# Patient Record
Sex: Male | Born: 1945 | Race: White | Hispanic: Yes | Marital: Married | Smoking: Former smoker
Health system: Southern US, Community
[De-identification: ages and names within clinical notes are randomized; demographics above are authoritative.]

---

## 2012-10-26 ENCOUNTER — Ambulatory Visit (INDEPENDENT_AMBULATORY_CARE_PROVIDER_SITE_OTHER): Payer: Medicare Other | Admitting: Internal Medicine

## 2012-10-26 VITALS — BP 158/90 | HR 76 | Temp 98.1°F | Resp 18 | Ht 66.0 in | Wt 171.4 lb

## 2012-10-26 DIAGNOSIS — L989 Disorder of the skin and subcutaneous tissue, unspecified: Secondary | ICD-10-CM

## 2012-10-26 DIAGNOSIS — L723 Sebaceous cyst: Secondary | ICD-10-CM

## 2012-10-26 DIAGNOSIS — F809 Developmental disorder of speech and language, unspecified: Secondary | ICD-10-CM

## 2012-10-26 NOTE — Patient Instructions (Addendum)
Extirpacin de un quiste (Cyst Removal) El profesional que lo asiste le ha extirpado un quiste. Un quiste es una cavidad que contiene material semilquido. Puede aparecer en cualquier lugar del cuerpo. Un quiste puede permanecer pequeo durante aos o aumentar de tamao gradualmente. Un quiste sebceo es un glndula sudorpara dilatada (que se [place]), y que se llena con secrecin sebcea (sudor). Si no se trata, puede agrandarse (hasta el tamao de una pelota de softball) con el paso de [place]. Generalmente son extirpados por motivos cosmticos (mejorar la apariencia) o antes de que se infecte para formar un absceso. Un absceso es una estructura qustica llena de pus. INSTRUCCIONES PARA EL CUIDADO DOMICILIARIO  Mantenga el vendaje, limpio y seco. Puede cambiarlo despus de 24 horas. Si el vendaje se adhiere, use agua tibia para aflojarlo suavemente. Seque la zona dando pequeos golpes con una toalla limpia antes de colocarse otro vendaje.  En lo posible, mantenga elevada la zona en la que fue extirpado el quiste para aliviar la hinchazn, Chief Technology Officer y Biochemist, clinical curacin.  Si tiene una sutura, mantngala limpia y [place].  Puede limpiar la sutura suavemente con un hisopo de algodn mojado en agua jabonosa tibia.  No remoje la zona en la que fue extirpado el quiste ni practique natacin. Puede ducharse.  No utilice demasiado la zona en la que fue extirpado el quiste.  Regrese dentro de [address], o cuando se lo indiquen para Oceanographer los puntos de la sutura.  Tome los [employer] le indic el mdico. SOLICITE ATENCIN MDICA DE INMEDIATO SI:  La temperatura oral se eleva sin motivo por encima de 102 F (38.9 C) y no puede controlarla con los medicamentos, o segn le indique el profesional que lo asiste.  La sangre sigue mojando el vendaje.  Aumenta el dolor en la zona en la que fue extirpado el [place].  Presenta enrojecimiento, hinchazn, pus, mal olor, inflamacin (irritacin) observa  rayas rojas que salen de la sutura. Estos son signos de infeccin. EST SEGURO QUE:   Comprende las instrucciones para el alta mdica.  Controlar su enfermedad.  Solicitar atencin mdica de inmediato segn las indicaciones. Document Released: 09/24/2005 Document Revised: 03/08/2012 [facility] Patient Information 2013 [place], [place]. Cuidados de una herida  (Wound Care)  El cuidado de la herida evita el dolor y la infeccin.  Deber aplicarse la vacuna contra el ttanos si:  No recuerda cundo se coloc la vacuna la ltima vez.   Nunca recibi esta vacuna.   La lesin ha [employer].  Si usted necesita aplicarse la vacuna y se niega a recibirla, corre riesgo de contraer ttanos. sta es una enfermedad grave.  CUIDADOS EN EL HOGAR   Slo tome la medicacin segn las indicaciones.   Limpie la herida [employer] con [place] y [place], segn las indicaciones.   Cambie elvendaje) tal como se le indic.   Aplique una crema con medicamento sobre la herida, segn le indique el mdico.   Cmbielo si se moja, se ensucia o huele mal.   Puede tomar Shaune Spittle. No debe tomar baos de inmersin, nadar ni hacer nada que haga que la herida se encuentre debajo del agua.   Mantenga elevada y en reposo la zona lesionada hasta que el dolor y la hinchazn mejoren.   Cumpla con los controles mdicos segn las indicaciones.  SOLICITE AYUDA DE INMEDIATO SI:   Observa que un lquido blanco amarillento (pus) aparece en la zona de la herida.   Los [employer]  no le Corporate treasurer.   Hay rayas rojas que salen de la herida.   No puede mover los dedos.   Tiene fiebre.  ASEGRESE DE QUE:   Comprende estas instrucciones.   Controlar su enfermedad.   Solicitar ayuda de inmediato si no mejora o si empeora.  Document Released: 08/13/2011 Document Revised: 12/04/2011 [facility] Patient Information 2012 [place], [place].

## 2012-10-26 NOTE — Progress Notes (Signed)
   Patient ID: Ladarious Kresse MRN: [id number], DOB: 1946-11-01, 66 y.o. Date of Encounter: 10/26/2012, 2:13 PM    PROCEDURE NOTE: Verbal consent obtained. Betadine prep per usual protocol. Local anesthesia obtained with 1% plain lidocaine 1.5 cc.  1 cm incision made with 11 blade along lesion.  Large amount of sebaceous material expressed No purulence expressed Lesion explored revealing no loculations. Irrigated with normal saline Dressed. Wound care instructions including precautions with patient. Patient tolerated the procedure well.   Signed, Eula Listen, PA-C 10/26/2012 2:13 PM

## 2012-10-26 NOTE — Progress Notes (Signed)
  Subjective:    Patient ID: Jose Mack, male    DOB: 10/23/1946, 66 y.o.   MRN: [id number]  HPI Has slow growing sebacious cyst right face near ear. Hx of I and D in past. Not red , warm, or tender. Able to express small stream of cyst goop.   Review of Systems    neg Objective:   Physical Exam 3-4 cm fluctuant non tender cyst right face  Mr. Tennis Must to ID       Assessment & Plan:  Wound care

## 2018-01-04 ENCOUNTER — Ambulatory Visit: Payer: Medicare Other | Admitting: Physician Assistant

## 2018-01-05 ENCOUNTER — Ambulatory Visit: Payer: Medicare Other | Admitting: Physician Assistant

## 2018-01-07 ENCOUNTER — Encounter (HOSPITAL_COMMUNITY): Payer: Self-pay

## 2018-01-07 ENCOUNTER — Encounter: Payer: Self-pay | Admitting: Physician Assistant

## 2018-01-07 ENCOUNTER — Emergency Department (HOSPITAL_COMMUNITY): Payer: Medicare Other

## 2018-01-07 ENCOUNTER — Ambulatory Visit (INDEPENDENT_AMBULATORY_CARE_PROVIDER_SITE_OTHER): Payer: Medicare Other | Admitting: Physician Assistant

## 2018-01-07 VITALS — BP 182/90 | HR 76 | Temp 98.0°F | Resp 16 | Ht 66.0 in | Wt 194.0 lb

## 2018-01-07 DIAGNOSIS — H539 Unspecified visual disturbance: Secondary | ICD-10-CM | POA: Diagnosis not present

## 2018-01-07 DIAGNOSIS — H538 Other visual disturbances: Secondary | ICD-10-CM

## 2018-01-07 DIAGNOSIS — I10 Essential (primary) hypertension: Secondary | ICD-10-CM | POA: Insufficient documentation

## 2018-01-07 DIAGNOSIS — Z87891 Personal history of nicotine dependence: Secondary | ICD-10-CM | POA: Insufficient documentation

## 2018-01-07 DIAGNOSIS — R030 Elevated blood-pressure reading, without diagnosis of hypertension: Secondary | ICD-10-CM | POA: Diagnosis not present

## 2018-01-07 LAB — CBC
HCT: 45.1 % (ref 39.0–52.0)
Hemoglobin: 14.6 g/dL (ref 13.0–17.0)
MCH: 29.3 pg (ref 26.0–34.0)
MCHC: 32.4 g/dL (ref 30.0–36.0)
MCV: 90.6 fL (ref 78.0–100.0)
Platelets: 169 10*3/uL (ref 150–400)
RBC: 4.98 MIL/uL (ref 4.22–5.81)
RDW: 13.8 % (ref 11.5–15.5)
WBC: 8.9 10*3/uL (ref 4.0–10.5)

## 2018-01-07 LAB — I-STAT CHEM 8, ED
BUN: 9 mg/dL (ref 6–20)
CHLORIDE: 106 mmol/L (ref 101–111)
Calcium, Ion: 1.17 mmol/L (ref 1.15–1.40)
Creatinine, Ser: 0.7 mg/dL (ref 0.61–1.24)
GLUCOSE: 103 mg/dL — AB (ref 65–99)
HCT: 47 % (ref 39.0–52.0)
Hemoglobin: 16 g/dL (ref 13.0–17.0)
POTASSIUM: 4 mmol/L (ref 3.5–5.1)
Sodium: 139 mmol/L (ref 135–145)
TCO2: 23 mmol/L (ref 22–32)

## 2018-01-07 LAB — I-STAT TROPONIN, ED: Troponin i, poc: 0 ng/mL (ref 0.00–0.08)

## 2018-01-07 LAB — COMPREHENSIVE METABOLIC PANEL
ALBUMIN: 4.1 g/dL (ref 3.5–5.0)
ALK PHOS: 87 U/L (ref 38–126)
ALT: 20 U/L (ref 17–63)
AST: 22 U/L (ref 15–41)
Anion gap: 10 (ref 5–15)
BUN: 8 mg/dL (ref 6–20)
CHLORIDE: 105 mmol/L (ref 101–111)
CO2: 22 mmol/L (ref 22–32)
Calcium: 9.2 mg/dL (ref 8.9–10.3)
Creatinine, Ser: 0.85 mg/dL (ref 0.61–1.24)
GFR calc Af Amer: >60 mL/min (ref >60)
GFR calc non Af Amer: >60 mL/min (ref >60)
GLUCOSE: 100 mg/dL — AB (ref 65–99)
Potassium: 4 mmol/L (ref 3.5–5.1)
SODIUM: 137 mmol/L (ref 135–145)
Total Bilirubin: 0.6 mg/dL (ref 0.3–1.2)
Total Protein: 7.5 g/dL (ref 6.5–8.1)

## 2018-01-07 LAB — DIFFERENTIAL
BASOS ABS: 0 10*3/uL (ref 0.0–0.1)
BASOS PCT: 0 %
Eosinophils Absolute: 0.1 10*3/uL (ref 0.0–0.7)
Eosinophils Relative: 1 %
LYMPHS PCT: 24 %
Lymphs Abs: 2.2 10*3/uL (ref 0.7–4.0)
MONO ABS: 0.6 10*3/uL (ref 0.1–1.0)
Monocytes Relative: 6 %
NEUTROS ABS: 6 10*3/uL (ref 1.7–7.7)
NEUTROS PCT: 69 %

## 2018-01-07 LAB — POCT URINALYSIS DIP (MANUAL ENTRY)
BILIRUBIN UA: NEGATIVE mg/dL
Bilirubin, UA: NEGATIVE
Glucose, UA: NEGATIVE mg/dL
Leukocytes, UA: NEGATIVE
Nitrite, UA: NEGATIVE
PROTEIN UA: NEGATIVE mg/dL
SPEC GRAV UA: 1.01 (ref 1.010–1.025)
UROBILINOGEN UA: 0.2 U/dL
pH, UA: 6 (ref 5.0–8.0)

## 2018-01-07 LAB — PROTIME-INR
INR: 1
PROTHROMBIN TIME: 13.1 s (ref 11.4–15.2)

## 2018-01-07 LAB — APTT: APTT: 29 s (ref 24–36)

## 2018-01-07 NOTE — Patient Instructions (Addendum)
Please head immediately to the [facility].   [address] [place], [phone] for your high blood pressure.     IF you received an x-ray today, you will receive an invoice from [facility]. Please contact [facility] at [phone] with questions or concerns regarding your invoice.   IF you received labwork today, you will receive an invoice from [facility]. Please contact [facility] at [phone] with questions or concerns regarding your invoice.   Our billing staff will not be able to assist you with questions regarding bills from these companies.  You will be contacted with the lab results as soon as they are available. The fastest way to get your results is to activate your My Chart account. Instructions are located on the last page of this paperwork. If you have not heard from us regarding the results in 2 weeks, please contact this office.

## 2018-01-07 NOTE — ED Notes (Signed)
Pt from [facility] due to hypertension 212/112 and c/o dyspnea and blurry vision ongoing x 5 months

## 2018-01-07 NOTE — Progress Notes (Signed)
PRIMARY CARE AT [place], [place] [address] [phone]  Date:  01/07/2018   Name:  Jose Mack   DOB:  Apr 15, 1946   MRN:  [id number]  PCP:  Patient, No Pcp Per    History of Present Illness:  Jose Mack is a 72 y.o. male patient who presents to PCP with  Chief Complaint  Patient presents with  . Blurred Vision    x 5 months/ losing vision in one eye, both.  . Hypertension    210/102,212/131  . other    the person with the pt states that he does not say the illness that are bothering him     5 months ago, developed some vision changes.  Reports that oil was in his eye, though his partner states that he did not have a change in his bp.  He has had 1 week of extreme cloudiness in his right eye. He has had to move the tv forward due to the vision changes.   Daughter would like his ekg performed.  No dizziness.  No difficulty with breathing.   He feels some difficulty with his breathing, as if "he is not breathing correctly".  5 months ago, daughter reports he would work with her, but now he resides in his house.   Daughter states that she is concerned that he will not follow up and would like a full work up.    There are no active problems to display for this patient.   No past medical history on file.  No past surgical history on file.  Social History   Tobacco Use  . Smoking status: Former Smoker    Last attempt to quit: 10/27/2007    Years since quitting: 10.2  Substance Use Topics  . Alcohol use: No  . Drug use: No    No family history on file.  Not on File  Medication list has been reviewed and updated.  No current outpatient medications on file prior to visit.   No current facility-administered medications on file prior to visit.     ROS ROS otherwise unremarkable unless listed above.  Physical Examination: BP (!) 212/131   Pulse 76   Temp 98 F (36.7 C) (Oral)   Resp 16   Ht '5\' 6"'  (1.676 m)   Wt 194 lb (88 kg)   SpO2 96%   BMI  31.31 kg/m  Ideal Body Weight: Weight in (lb) to have BMI = 25: 154.6  Physical Exam  Constitutional: He is oriented to person, place, and time. He appears well-developed and well-nourished. No distress.  HENT:  Head: Normocephalic and atraumatic.  Eyes: EOM are normal. Pupils are equal, round, and reactive to light. Right conjunctiva is injected. Left conjunctiva is injected.  Arcus senilis.  Fundoscopic exam.    Cardiovascular: Normal rate, regular rhythm and normal heart sounds. Exam reveals no friction rub.  No murmur heard. Pulses:      Radial pulses are 2+ on the right side, and 2+ on the left side.       Dorsalis pedis pulses are 2+ on the right side, and 2+ on the left side.  Pulmonary/Chest: Effort normal and breath sounds normal. No stridor. No apnea. No respiratory distress. He has no wheezes.  Neurological: He is alert and oriented to person, place, and time.  Skin: Skin is warm and dry. He is not diaphoretic.  Psychiatric: He has a normal mood and affect. His behavior is normal.     Visual  Acuity Screening   Right eye Left eye Both eyes  Without correction:  20/70 20/20  With correction:     Comments: Pt states he can't see any letters on chart with right eye   Assessment and Plan: Jose Mack is a 72 y.o. male who is here today for cc of  Chief Complaint  Patient presents with  . Blurred Vision    x 5 months/ losing vision in one eye, both.  . Hypertension    210/102,212/131  . other    the person with the pt states that he does not say the illness that are bothering him  concern of target organ damage.  Advised option of controlling blood pressure slowly, and referral with eye doctor and, and CT stat.  Patient's daughter would like patient to be seen at the hospital.   Vision changes - Plan: CMP14+EGFR, POCT urinalysis dipstick  Elevated blood pressure reading - Plan: CMP14+EGFR, EKG 12-Lead, POCT urinalysis dipstick  Ivar Drape, PA-C [facility] 1/11/201910:47 AM

## 2018-01-07 NOTE — ED Triage Notes (Addendum)
Per Pt, Pt is coming from PCP with complaints of Hypertension, and vision loss in right eye that is cloudy starting three days ago. No other neuro deficits noted. Denies CP.

## 2018-01-08 ENCOUNTER — Encounter: Payer: Self-pay | Admitting: Physician Assistant

## 2018-01-08 ENCOUNTER — Emergency Department (HOSPITAL_COMMUNITY)
Admission: EM | Admit: 2018-01-08 | Discharge: 2018-01-08 | Disposition: A | Discharge status: Home / Self Care | Payer: Medicare Other | Attending: Emergency Medicine | Admitting: Emergency Medicine

## 2018-01-08 DIAGNOSIS — H539 Unspecified visual disturbance: Secondary | ICD-10-CM

## 2018-01-08 DIAGNOSIS — I10 Essential (primary) hypertension: Secondary | ICD-10-CM

## 2018-01-08 LAB — CMP14+EGFR
A/G RATIO: 1.6 (ref 1.2–2.2)
ALBUMIN: 4.5 g/dL (ref 3.5–4.8)
ALK PHOS: 97 IU/L (ref 39–117)
ALT: 17 IU/L (ref 0–44)
AST: 19 IU/L (ref 0–40)
BUN / CREAT RATIO: 12 (ref 10–24)
BUN: 9 mg/dL (ref 8–27)
Bilirubin Total: 0.2 mg/dL (ref 0.0–1.2)
CO2: 23 mmol/L (ref 20–29)
Calcium: 9.3 mg/dL (ref 8.6–10.2)
Chloride: 103 mmol/L (ref 96–106)
Creatinine, Ser: 0.77 mg/dL (ref 0.76–1.27)
GFR calc Af Amer: 106 mL/min/{1.73_m2} (ref >59)
GFR calc non Af Amer: 91 mL/min/{1.73_m2} (ref >59)
GLOBULIN, TOTAL: 2.8 g/dL (ref 1.5–4.5)
Glucose: 105 mg/dL — ABNORMAL HIGH (ref 65–99)
POTASSIUM: 5 mmol/L (ref 3.5–5.2)
SODIUM: 141 mmol/L (ref 134–144)
Total Protein: 7.3 g/dL (ref 6.0–8.5)

## 2018-01-08 LAB — CBG MONITORING, ED: Glucose-Capillary: 94 mg/dL (ref 65–99)

## 2018-01-08 MED ORDER — TETRACAINE HCL 0.5 % OP SOLN
2.0000 [drp] | Freq: Once | OPHTHALMIC | Status: AC
  Administered 2018-01-08: 2 [drp] via OPHTHALMIC
  Filled 2018-01-08: qty 4

## 2018-01-08 MED ORDER — FLUORESCEIN SODIUM 1 MG OP STRP
ORAL_STRIP | OPHTHALMIC | Status: AC
  Administered 2018-01-08: 1
  Filled 2018-01-08: qty 1

## 2018-01-08 NOTE — ED Notes (Signed)
Visual acuity  R eye- 20/100 L eye- Unable to see Both- 20/100

## 2018-01-08 NOTE — ED Provider Notes (Signed)
[facility] EMERGENCY DEPARTMENT Provider Note   CSN: [id number] Arrival date & time: 01/07/18  1525     History   Chief Complaint Chief Complaint  Patient presents with  . Hypertension    HPI Jose Mack is a 72 y.o. male.  The history is provided by the patient and a relative. A language interpreter was used Pension scheme manager  interpreter (spanish)).  Hypertension  This is a chronic problem. The current episode started more than 1 week ago. The problem occurs constantly. The problem has not changed since onset.Pertinent negatives include no chest pain, no abdominal pain, no headaches and no shortness of breath.  Eye Problem   This is a chronic problem. The current episode started more than 1 week ago. The problem occurs daily. The problem has been gradually worsening. There is a problem in the left eye. The pain is mild. There is no history of trauma to the eye. He does not wear contacts. Associated symptoms include decreased vision. Pertinent negatives include no weakness. He has tried nothing for the symptoms.   Pt presents for evaluation for eye issues and HTN Pt reports he may have gotten oil in his left eye over a month ago No recent trauma Over past 3 weeks he has had decreasing vision in left eye Over past several days family has noted redness to left eye He denies headache/neck pain/chest pain/abdominal pain No focal arm or leg weakness. No slurred speech, no dizziness He has no other known medical problems  PMH-none Soc hx - lives at home  Home Medications    Prior to Admission medications   Not on File    Family History No family history on file.  Social History Social History   Tobacco Use  . Smoking status: Former Smoker    Last attempt to quit: 10/27/2007    Years since quitting: 10.2  . Smokeless tobacco: Never Used  Substance Use Topics  . Alcohol use: No  . Drug use: No     Allergies   Patient has no known  allergies.   Review of Systems Review of Systems  Constitutional: Negative for fever.  Eyes: Positive for visual disturbance.  Respiratory: Negative for shortness of breath.   Cardiovascular: Negative for chest pain.  Gastrointestinal: Negative for abdominal pain.  Neurological: Negative for dizziness, speech difficulty, weakness and headaches.  All other systems reviewed and are negative.    Physical Exam Updated Vital Signs BP (!) 207/101   Pulse 71   Temp 97.6 F (36.4 C) (Oral)   Resp (!) 21   Ht 1.676 m (5\' 6" )   Wt 88 kg (194 lb)   SpO2 95%   BMI 31.31 kg/m   Physical Exam CONSTITUTIONAL: Well developed/well nourished HEAD: Normocephalic/atraumatic EYES: EOMI/PERRL, mild corneal haze into the left, IOP equals 30 in left eye, IOP equals 23 in right eye.  No foreign bodies noted Questionable abrasion in OS.  Visual acuity noted Conjunctival injection noted ENMT: Mucous membranes moist NECK: supple no meningeal signs, no bruits CV: S1/S2 noted, no murmurs/rubs/gallops noted LUNGS: Lungs are clear to auscultation bilaterally, no apparent distress ABDOMEN: soft, nontender, no rebound or guarding GU:no cva tenderness NEURO:Awake/alert, face symmetric, no arm or leg drift is noted Equal 5/5 strength with shoulder abduction, elbow flex/extension, wrist flex/extension in upper extremities and equal hand grips bilaterally Equal 5/5 strength with hip flexion,knee flex/extension, foot dorsi/plantar flexion Sensation to light touch intact in all extremities EXTREMITIES: pulses normal, full ROM SKIN: warm, color  normal PSYCH: no abnormalities of mood noted   ED Treatments / Results  Labs (all labs ordered are listed, but only abnormal results are displayed) Labs Reviewed  COMPREHENSIVE METABOLIC PANEL - Abnormal; Notable for the following components:      Result Value   Glucose, Bld 100 (*)    All other components within normal limits  I-STAT CHEM 8, ED - Abnormal;  Notable for the following components:   Glucose, Bld 103 (*)    All other components within normal limits  PROTIME-INR  APTT  CBC  DIFFERENTIAL  I-STAT TROPONIN, ED  CBG MONITORING, ED    EKG  EKG Interpretation  Date/Time:  Thursday January 07 2018 15:35:10 EST Ventricular Rate:  76 PR Interval:  136 QRS Duration: 88 QT Interval:  372 QTC Calculation: 418 R Axis:   44 Text Interpretation:  Normal sinus rhythm Cannot rule out Anterior infarct , age undetermined Abnormal ECG No previous ECGs available Confirmed by Zadie Rhine ([id number]) on 01/08/2018 1:24:24 AM       Radiology Ct Head Wo Contrast  Result Date: 01/07/2018 CLINICAL DATA:  5 months ago, developed some vision changes. Reports that oil was in his eye, though his partner states that he did not have a change in his BP. He has had 1 week of extreme cloudiness in his right eye. EXAM: CT HEAD WITHOUT CONTRAST TECHNIQUE: Contiguous axial images were obtained from the base of the skull through the vertex without intravenous contrast. COMPARISON:  None. FINDINGS: Brain: There are mild periventricular white matter changes consistent with small vessel disease. Small old lacunar infarcts are identified within the left basal ganglia. There is no evidence for hemorrhage, mass lesion, or acute infarction. Vascular: There is atherosclerotic calcifications of the carotic siphons. Skull: Normal. Negative for fracture or focal lesion. Sinuses/Orbits: No acute finding. Other: None IMPRESSION: 1. Mild periventricular white matter changes. 2. 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 3.  No evidence for acute  abnormality. Electronically Signed   By: Norva Pavlov M.D.   On: 01/07/2018 17:05    Procedures Procedures (including critical care time)  Medications Ordered in ED Medications  tetracaine (PONTOCAINE) 0.5 % ophthalmic solution 2 drop (2 drops Right Eye Given 01/08/18 0231)  fluorescein 1 MG ophthalmic strip (1 strip   Given 01/08/18 0250)     Initial Impression / Assessment and Plan / ED Course  I have reviewed the triage vital signs and the nursing notes.  Pertinent labs results that were available during my care of the patient were reviewed by me and considered in my medical decision making (see chart for details).     This is a very difficult history, this patient speaks no [place], and he already been sent from urgent care and waiting for several hours upon my evaluation. There was concern for possible stroke due to reported visual changes, as well as elevated blood pressure However after full history and physical with interpreter, it appears that his main issue is visual loss in his left eye which has been present for several weeks There are no other signs of acute stroke Suspect his hypertension is actually chronic as this patient never sees a physician  I was concerned about his visual changes, as well as his eye exam and is IOP at 30 in left eye I discussed the case at length with Dr. Dione Booze with ophthalmology He feels the best plan will be for this patient to see him in his office in the  next 5 hours He does not recommend any emergent medications given the fact that has been present for weeks  Plan will be to discharge home, with follow-up with ophthalmology in 5 hours We will also refer this patient to a primary care provider to manage his blood pressure however will defer starting any medications as I feel that his ophthalmologic issue is a primary issue at this time Final Clinical Impressions(s) / ED Diagnoses   Final diagnoses:  Essential hypertension  Visual changes    ED Discharge Orders    None       Zadie RhineWickline, Kathrine Rieves, MD 01/08/18 [phone]

## 2018-03-01 ENCOUNTER — Telehealth: Payer: Self-pay | Admitting: Physician Assistant

## 2018-03-01 NOTE — Telephone Encounter (Signed)
Called pt to have them call to the office and make an appt  for labs. If the lab orders are in there then they will only need a FT nurse visit - so no appt.   When pt calls back, please let him know that he will need to come in for labs and can stop by at anytime without an appt for the labs.

## 2018-03-01 NOTE — Telephone Encounter (Addendum)
[facility]  Sent over labs for the patient.  I am keeping this in my box until he returns. Please schedule him an appointment for the labs they wish to obtain.  This can be with anyone.  There is a language barrier.

## 2018-03-29 ENCOUNTER — Encounter: Payer: Self-pay | Admitting: Physician Assistant

## 2019-06-23 ENCOUNTER — Ambulatory Visit: Payer: Medicare Other | Admitting: Family Medicine

## 2019-08-04 IMAGING — CT CT HEAD W/O CM
3 series · 15 of 47 positions shown, 18 images · non-contrast
Comparison: None.

CLINICAL DATA: 5 months ago, developed some vision changes. Reports
that oil was in his eye, though his partner states that he did not
have a change in his BP. He has had 1 week of extreme cloudiness in
his right eye.

EXAM:
CT HEAD WITHOUT CONTRAST
TECHNIQUE: Contiguous axial images were obtained from the base of the skull
through the vertex without intravenous contrast.

[Series 3: head 5.0 h30s · axial · 0.44mm/px · z∈[-77,+63]mm · 9 of 34 slices shown, 12 images]
[im 3/34  brain]
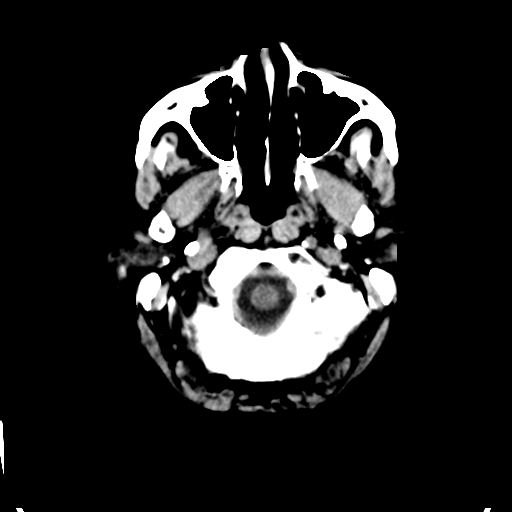
[im 3/34  bone]
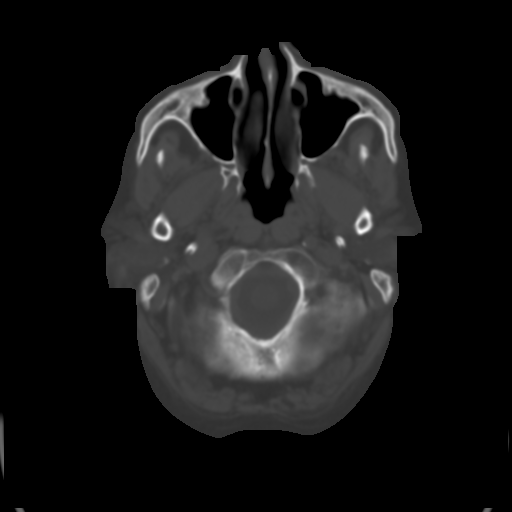
[im 6/34  brain]
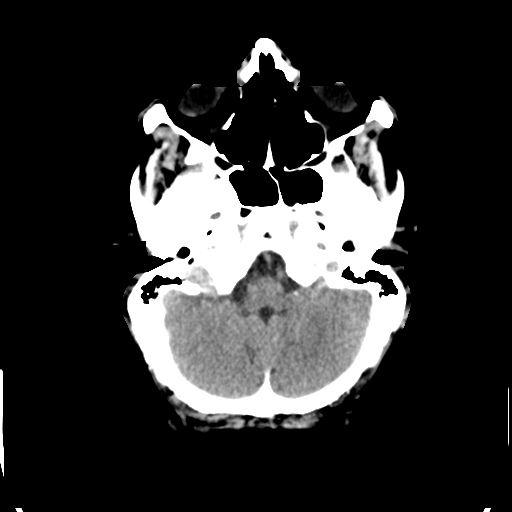
[im 10/34  brain]
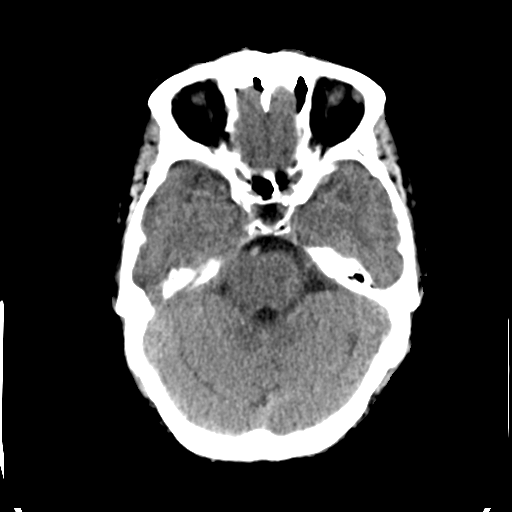
[im 13/34  brain]
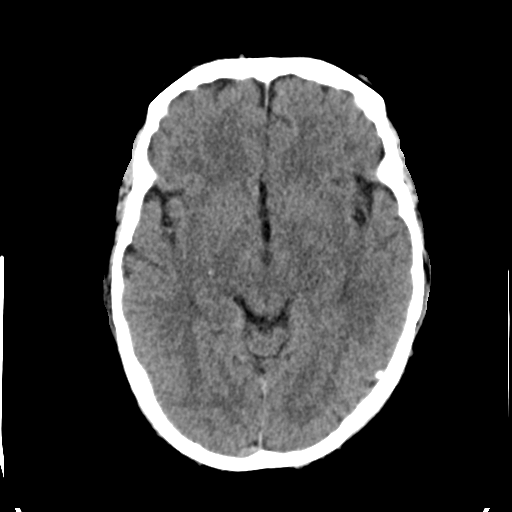
[im 18/34  brain]
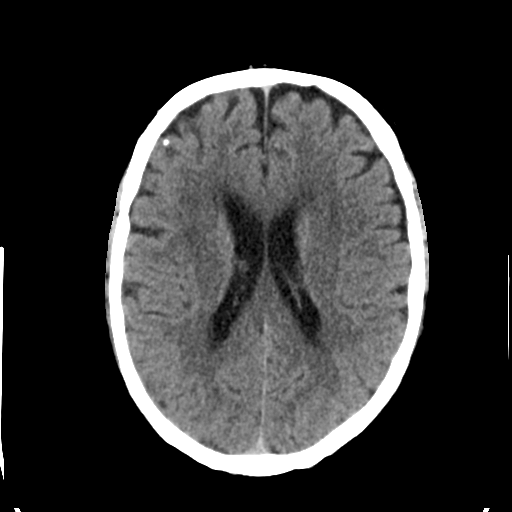
[im 18/34  bone]
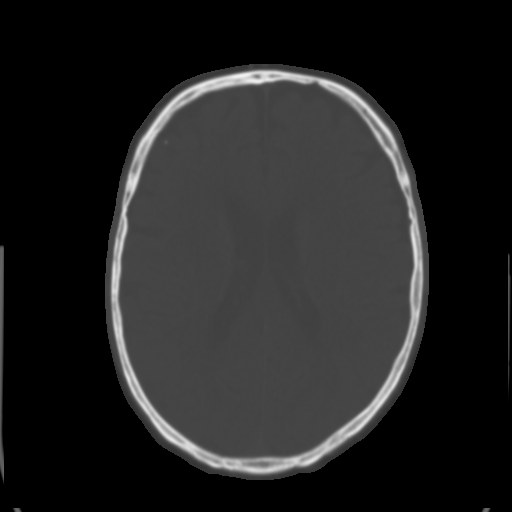
[im 21/34  brain]
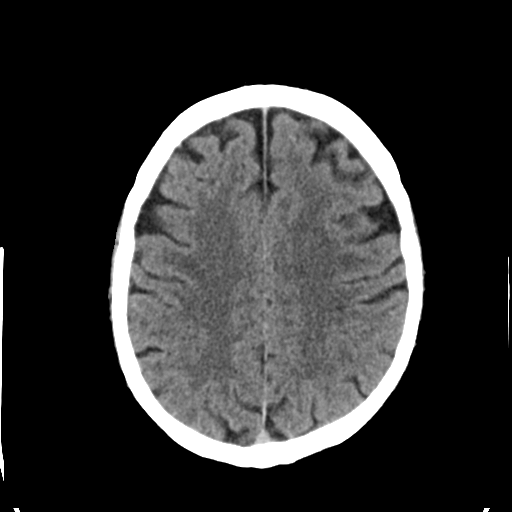
[im 24/34  brain]
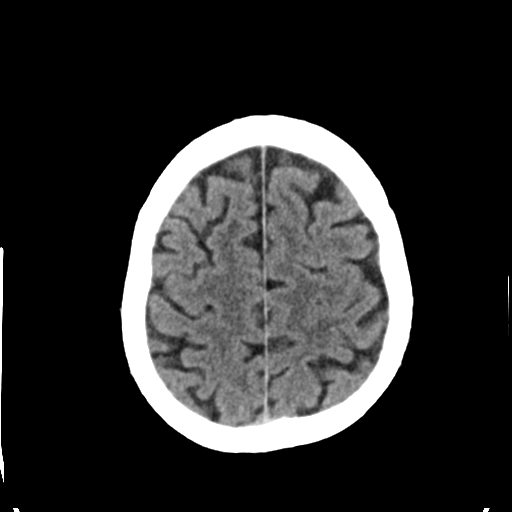
[im 28/34  brain]
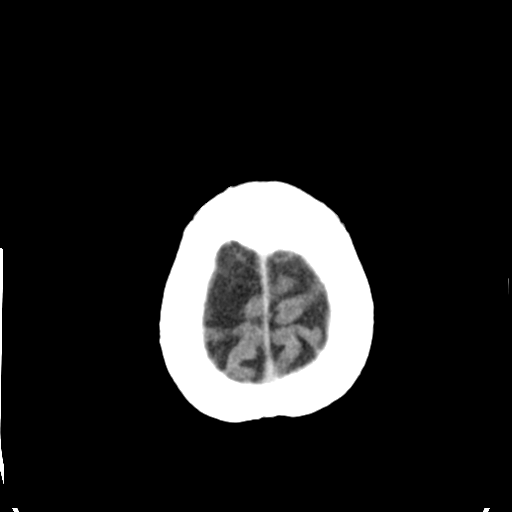
[im 31/34  brain]
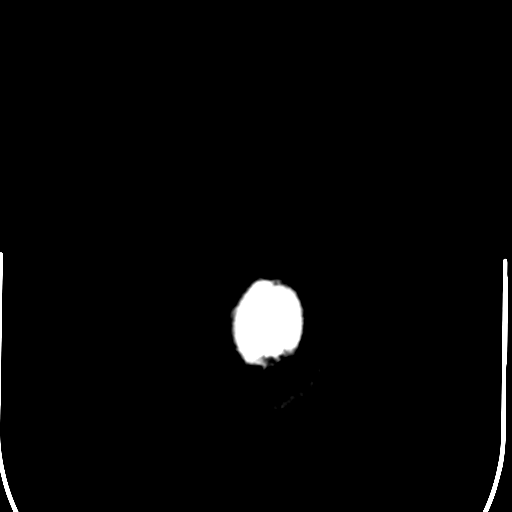
[im 31/34  bone]
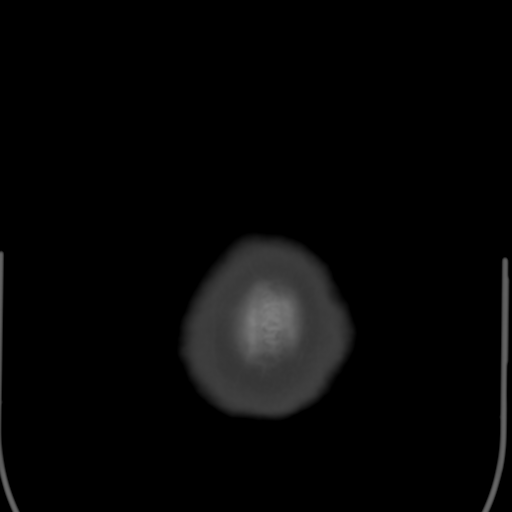

[Series 5: head 3.0 mpr cor · coronal · 0.32mm/px · 3 of 71 slices shown]
[im 24/71  brain]
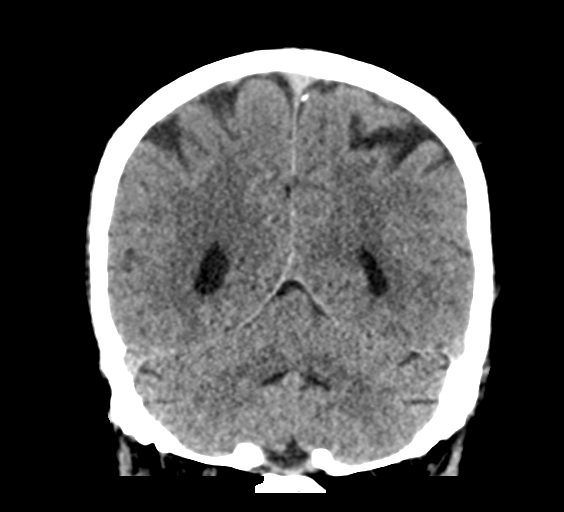
[im 32/71  brain]
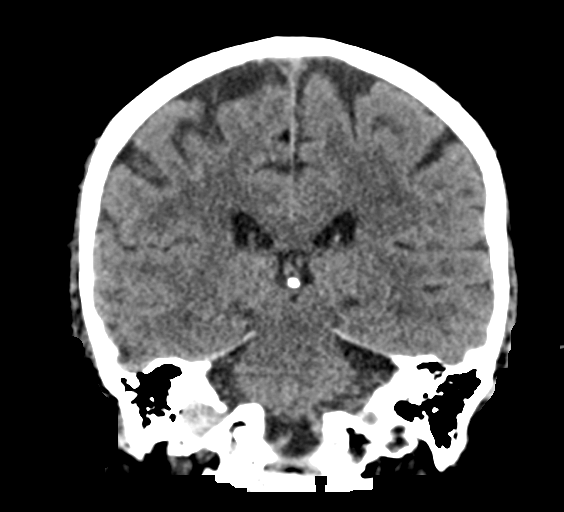
[im 39/71  brain]
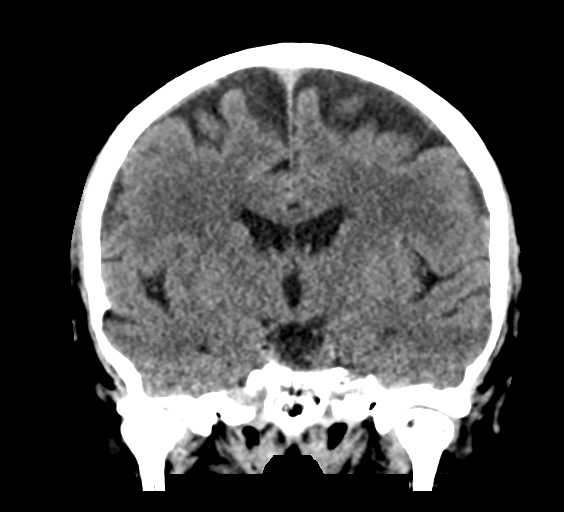

[Series 6: head 3.0 mpr sag · sagittal · 0.33mm/px · 3 of 67 slices shown]
[im 23/67  brain]
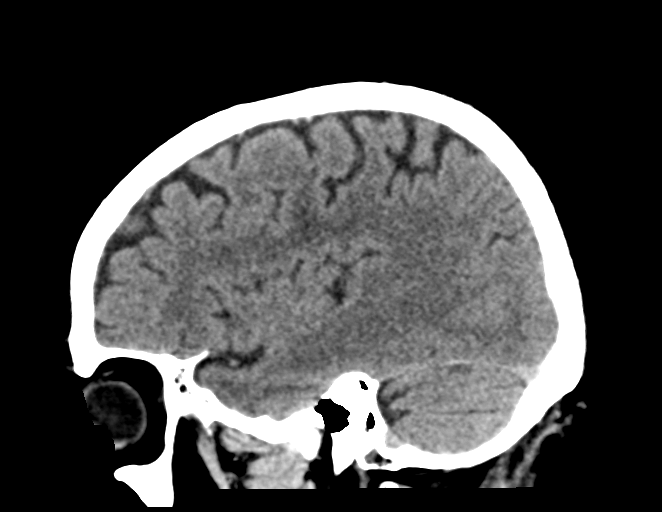
[im 34/67  brain]
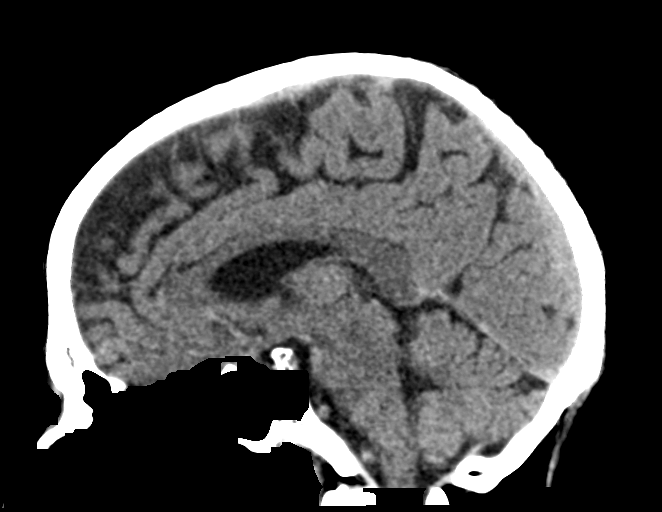
[im 45/67  brain]
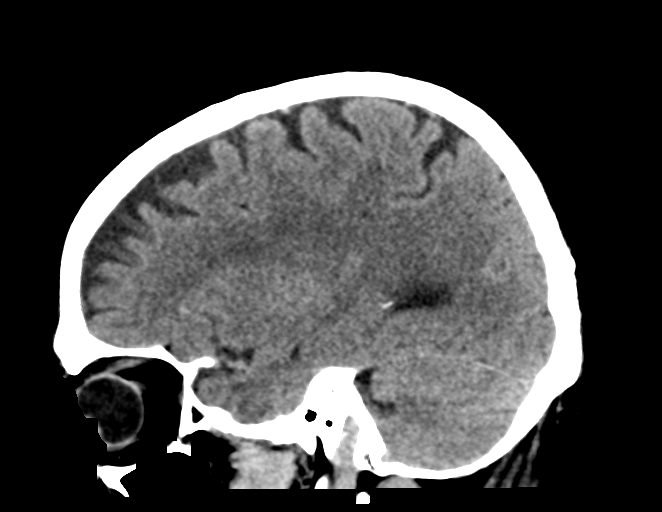

[15 of 47 positions shown; findings below may reference images not displayed]

FINDINGS: Brain: There are mild periventricular white matter changes
consistent with small vessel disease. Small old lacunar infarcts are
identified within the left basal ganglia. There is no evidence for
hemorrhage, mass lesion, or acute infarction.

Vascular: There is atherosclerotic calcifications of the carotic
siphons.

Skull: Normal. Negative for fracture or focal lesion.

Sinuses/Orbits: No acute finding.

Other: None
IMPRESSION: 1. Mild periventricular white matter changes.
2. Small old lacunar infarcts of the left basal ganglia.
3.  No evidence for acute  abnormality.
